# Patient Record
Sex: Male | Born: 1965 | Race: Black or African American | Hispanic: No | Marital: Single
Health system: Midwestern US, Community
[De-identification: ages and names within clinical notes are randomized; demographics above are authoritative.]

## PROBLEM LIST (undated history)

## (undated) ENCOUNTER — Ambulatory Visit: Admission: EM | Payer: Commercial Managed Care - PPO | Source: Home / Self Care

## (undated) DIAGNOSIS — R0602 Shortness of breath: Secondary | ICD-10-CM

## (undated) DIAGNOSIS — J9819 Other pulmonary collapse: Secondary | ICD-10-CM

## (undated) DIAGNOSIS — I10 Essential (primary) hypertension: Secondary | ICD-10-CM

## (undated) DIAGNOSIS — R51 Headache: Secondary | ICD-10-CM

## (undated) DIAGNOSIS — G4730 Sleep apnea, unspecified: Secondary | ICD-10-CM

## (undated) HISTORY — DX: Shortness of breath: R06.02

## (undated) HISTORY — DX: Sleep apnea, unspecified: G47.30

## (undated) HISTORY — DX: Essential (primary) hypertension: I10

## (undated) HISTORY — DX: Other pulmonary collapse: J98.19

## (undated) HISTORY — DX: Headache: R51

---

## 2000-11-09 ENCOUNTER — Encounter: Payer: Self-pay | Admitting: Family Medicine

## 2000-11-09 ENCOUNTER — Encounter: Admission: RE | Admit: 2000-11-09 | Discharge: 2000-11-09 | Payer: Self-pay | Admitting: Family Medicine

## 2003-08-21 ENCOUNTER — Encounter: Admission: RE | Admit: 2003-08-21 | Discharge: 2003-08-21 | Payer: Self-pay | Admitting: Family Medicine

## 2003-12-10 ENCOUNTER — Ambulatory Visit (HOSPITAL_BASED_OUTPATIENT_CLINIC_OR_DEPARTMENT_OTHER): Admission: RE | Admit: 2003-12-10 | Discharge: 2003-12-10 | Payer: Self-pay | Admitting: Family Medicine

## 2004-05-21 ENCOUNTER — Ambulatory Visit (HOSPITAL_COMMUNITY): Admission: RE | Admit: 2004-05-21 | Discharge: 2004-05-21 | Payer: Self-pay | Admitting: *Deleted

## 2005-02-28 ENCOUNTER — Inpatient Hospital Stay (HOSPITAL_COMMUNITY): Admission: EM | Admit: 2005-02-28 | Discharge: 2005-03-03 | Payer: Self-pay | Admitting: Emergency Medicine

## 2006-08-03 ENCOUNTER — Emergency Department (HOSPITAL_COMMUNITY): Admission: EM | Admit: 2006-08-03 | Discharge: 2006-08-03 | Payer: Self-pay | Admitting: Emergency Medicine

## 2011-10-17 ENCOUNTER — Encounter: Payer: Self-pay | Admitting: *Deleted

## 2011-10-17 ENCOUNTER — Other Ambulatory Visit: Payer: Self-pay | Admitting: *Deleted

## 2012-04-06 ENCOUNTER — Encounter: Payer: Self-pay | Admitting: Cardiology

## 2012-07-25 ENCOUNTER — Encounter: Payer: Self-pay | Admitting: Cardiology

## 2014-03-13 ENCOUNTER — Emergency Department (HOSPITAL_COMMUNITY)
Admission: EM | Admit: 2014-03-13 | Discharge: 2014-03-13 | Disposition: A | Discharge status: Home / Self Care | Payer: Self-pay | Attending: Emergency Medicine | Admitting: Emergency Medicine

## 2014-03-13 ENCOUNTER — Encounter (HOSPITAL_COMMUNITY): Payer: Self-pay | Admitting: Emergency Medicine

## 2014-03-13 DIAGNOSIS — Z8669 Personal history of other diseases of the nervous system and sense organs: Secondary | ICD-10-CM | POA: Insufficient documentation

## 2014-03-13 DIAGNOSIS — R197 Diarrhea, unspecified: Secondary | ICD-10-CM | POA: Insufficient documentation

## 2014-03-13 DIAGNOSIS — R110 Nausea: Secondary | ICD-10-CM | POA: Insufficient documentation

## 2014-03-13 DIAGNOSIS — K5900 Constipation, unspecified: Secondary | ICD-10-CM | POA: Insufficient documentation

## 2014-03-13 DIAGNOSIS — Z8709 Personal history of other diseases of the respiratory system: Secondary | ICD-10-CM | POA: Insufficient documentation

## 2014-03-13 DIAGNOSIS — Z87891 Personal history of nicotine dependence: Secondary | ICD-10-CM | POA: Insufficient documentation

## 2014-03-13 DIAGNOSIS — I10 Essential (primary) hypertension: Secondary | ICD-10-CM | POA: Insufficient documentation

## 2014-03-13 DIAGNOSIS — R1013 Epigastric pain: Secondary | ICD-10-CM | POA: Insufficient documentation

## 2014-03-13 DIAGNOSIS — R1032 Left lower quadrant pain: Secondary | ICD-10-CM

## 2014-03-13 LAB — URINALYSIS, ROUTINE W REFLEX MICROSCOPIC
Bilirubin Urine: NEGATIVE
Glucose, UA: NEGATIVE mg/dL
Hgb urine dipstick: NEGATIVE
Ketones, ur: 15 mg/dL — AB
Leukocytes, UA: NEGATIVE
Nitrite: NEGATIVE
Protein, ur: NEGATIVE mg/dL
Specific Gravity, Urine: 1.012 (ref 1.005–1.030)
Urobilinogen, UA: 0.2 mg/dL (ref 0.0–1.0)
pH: 7 (ref 5.0–8.0)

## 2014-03-13 LAB — COMPREHENSIVE METABOLIC PANEL
ALT: 17 U/L (ref 0–53)
AST: 17 U/L (ref 0–37)
Albumin: 4.2 g/dL (ref 3.5–5.2)
Alkaline Phosphatase: 79 U/L (ref 39–117)
Anion gap: 14 (ref 5–15)
BUN: 11 mg/dL (ref 6–23)
CO2: 28 mEq/L (ref 19–32)
Calcium: 9.8 mg/dL (ref 8.4–10.5)
Chloride: 99 mEq/L (ref 96–112)
Creatinine, Ser: 1.2 mg/dL (ref 0.50–1.35)
GFR calc Af Amer: 82 mL/min — ABNORMAL LOW (ref >90)
GFR calc non Af Amer: 70 mL/min — ABNORMAL LOW (ref >90)
Glucose, Bld: 107 mg/dL — ABNORMAL HIGH (ref 70–99)
Potassium: 4.3 mEq/L (ref 3.7–5.3)
Sodium: 141 mEq/L (ref 137–147)
Total Bilirubin: 0.8 mg/dL (ref 0.3–1.2)
Total Protein: 8.3 g/dL (ref 6.0–8.3)

## 2014-03-13 LAB — CBC WITH DIFFERENTIAL/PLATELET
Basophils Absolute: 0 10*3/uL (ref 0.0–0.1)
Basophils Relative: 0 % (ref 0–1)
Eosinophils Absolute: 0.1 10*3/uL (ref 0.0–0.7)
Eosinophils Relative: 1 % (ref 0–5)
HCT: 47 % (ref 39.0–52.0)
Hemoglobin: 16.3 g/dL (ref 13.0–17.0)
Lymphocytes Relative: 11 % — ABNORMAL LOW (ref 12–46)
Lymphs Abs: 1 10*3/uL (ref 0.7–4.0)
MCH: 31.3 pg (ref 26.0–34.0)
MCHC: 34.7 g/dL (ref 30.0–36.0)
MCV: 90.4 fL (ref 78.0–100.0)
Monocytes Absolute: 0.7 10*3/uL (ref 0.1–1.0)
Monocytes Relative: 8 % (ref 3–12)
Neutro Abs: 6.7 10*3/uL (ref 1.7–7.7)
Neutrophils Relative %: 80 % — ABNORMAL HIGH (ref 43–77)
Platelets: 280 10*3/uL (ref 150–400)
RBC: 5.2 MIL/uL (ref 4.22–5.81)
RDW: 13.3 % (ref 11.5–15.5)
WBC: 8.4 10*3/uL (ref 4.0–10.5)

## 2014-03-13 LAB — LIPASE, BLOOD: Lipase: 38 U/L (ref 11–59)

## 2014-03-13 MED ORDER — ONDANSETRON 4 MG PO TBDP: 4.0000 mg | ORAL_TABLET | Freq: Three times a day (TID) | ORAL | Status: DC | PRN

## 2014-03-13 MED ORDER — RANITIDINE HCL 150 MG/10ML PO SYRP
150.0000 mg | ORAL_SOLUTION | Freq: Once | ORAL | Status: AC
  Administered 2014-03-13: 150 mg via ORAL
  Filled 2014-03-13: qty 10

## 2014-03-13 MED ORDER — OXYCODONE-ACETAMINOPHEN 5-325 MG PO TABS
1.0000 | ORAL_TABLET | Freq: Once | ORAL | Status: AC
  Administered 2014-03-13: 1 via ORAL
  Filled 2014-03-13: qty 1

## 2014-03-13 MED ORDER — MORPHINE SULFATE 4 MG/ML IJ SOLN
4.0000 mg | Freq: Once | INTRAMUSCULAR | Status: AC
  Administered 2014-03-13: 4 mg via INTRAVENOUS
  Filled 2014-03-13: qty 1

## 2014-03-13 MED ORDER — RANITIDINE HCL 150 MG PO CAPS: 150.0000 mg | ORAL_CAPSULE | Freq: Every day | ORAL | Status: DC

## 2014-03-13 MED ORDER — GI COCKTAIL ~~LOC~~
30.0000 mL | Freq: Once | ORAL | Status: AC
  Administered 2014-03-13: 30 mL via ORAL
  Filled 2014-03-13: qty 30

## 2014-03-13 MED ORDER — ONDANSETRON 4 MG PO TBDP
4.0000 mg | ORAL_TABLET | Freq: Once | ORAL | Status: AC
  Administered 2014-03-13: 4 mg via ORAL
  Filled 2014-03-13: qty 1

## 2014-03-13 MED ORDER — SODIUM CHLORIDE 0.9 % IV BOLUS (SEPSIS)
1000.0000 mL | Freq: Once | INTRAVENOUS | Status: AC
  Administered 2014-03-13: 1000 mL via INTRAVENOUS

## 2014-03-13 MED ORDER — HYDROMORPHONE HCL 1 MG/ML IJ SOLN
1.0000 mg | Freq: Once | INTRAMUSCULAR | Status: AC
  Administered 2014-03-13: 1 mg via INTRAVENOUS
  Filled 2014-03-13: qty 1

## 2014-03-13 MED ORDER — AMOXICILLIN-POT CLAVULANATE 500-125 MG PO TABS: 1.0000 | ORAL_TABLET | Freq: Three times a day (TID) | ORAL | Status: AC

## 2014-03-13 MED ORDER — OXYCODONE-ACETAMINOPHEN 5-325 MG PO TABS: 2.0000 | ORAL_TABLET | Freq: Four times a day (QID) | ORAL | Status: DC | PRN

## 2014-03-13 NOTE — ED Notes (Signed)
Pt reports having mid to lower abd pain that started on Saturday. Pain does radiate to LLQ. Having n/v, reports having laxative on Sunday and now having green stools. Denies urinary symptoms.

## 2014-03-13 NOTE — Discharge Instructions (Signed)
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
## 2014-03-14 LAB — H. PYLORI ANTIBODY, IGG: H Pylori IgG: >8 {ISR} — ABNORMAL HIGH

## 2014-03-14 NOTE — ED Provider Notes (Signed)
Medical screening examination/treatment/procedure(s) were conducted as a shared visit with non-physician practitioner(s) or resident  and myself.  I personally evaluated the patient during the encounter and agree with the findings.   I have personally reviewed any xrays and/ or EKG's with the provider and I agree with interpretation.   Patient is mild left lower corner pain for 4-5 days. No bleeding, no vomiting. On exam patient mild left lower quadrant abdominal pain no guarding, no peritonitis. Discussed CT scan versus treating apparently for diverticulitis, patient comfortable treating apparently and will return for worsening symptoms or no improvement. No fever, no wbc elevation, benign abdo exam.  Left lower quadrant abdominal pain.   Mariea Clonts, MD 03/14/14 [phone]

## 2015-08-19 ENCOUNTER — Emergency Department (HOSPITAL_COMMUNITY): Payer: Commercial Managed Care - PPO

## 2015-08-19 ENCOUNTER — Emergency Department (HOSPITAL_COMMUNITY)
Admission: EM | Admit: 2015-08-19 | Discharge: 2015-08-19 | Disposition: A | Discharge status: Home / Self Care | Payer: Commercial Managed Care - PPO | Attending: Emergency Medicine | Admitting: Emergency Medicine

## 2015-08-19 ENCOUNTER — Encounter (HOSPITAL_COMMUNITY): Payer: Self-pay | Admitting: Emergency Medicine

## 2015-08-19 DIAGNOSIS — Z79899 Other long term (current) drug therapy: Secondary | ICD-10-CM | POA: Insufficient documentation

## 2015-08-19 DIAGNOSIS — S199XXA Unspecified injury of neck, initial encounter: Secondary | ICD-10-CM | POA: Insufficient documentation

## 2015-08-19 DIAGNOSIS — Z87891 Personal history of nicotine dependence: Secondary | ICD-10-CM | POA: Insufficient documentation

## 2015-08-19 DIAGNOSIS — Y9389 Activity, other specified: Secondary | ICD-10-CM | POA: Insufficient documentation

## 2015-08-19 DIAGNOSIS — S0990XA Unspecified injury of head, initial encounter: Secondary | ICD-10-CM | POA: Diagnosis not present

## 2015-08-19 DIAGNOSIS — Y998 Other external cause status: Secondary | ICD-10-CM | POA: Diagnosis not present

## 2015-08-19 DIAGNOSIS — I10 Essential (primary) hypertension: Secondary | ICD-10-CM | POA: Diagnosis not present

## 2015-08-19 DIAGNOSIS — M549 Dorsalgia, unspecified: Secondary | ICD-10-CM

## 2015-08-19 DIAGNOSIS — S299XXA Unspecified injury of thorax, initial encounter: Secondary | ICD-10-CM | POA: Diagnosis not present

## 2015-08-19 DIAGNOSIS — Y92410 Unspecified street and highway as the place of occurrence of the external cause: Secondary | ICD-10-CM | POA: Diagnosis not present

## 2015-08-19 DIAGNOSIS — M542 Cervicalgia: Secondary | ICD-10-CM

## 2015-08-19 MED ORDER — ACETAMINOPHEN 325 MG PO TABS
650.0000 mg | ORAL_TABLET | Freq: Once | ORAL | Status: AC
  Administered 2015-08-19: 650 mg via ORAL
  Filled 2015-08-19: qty 2

## 2015-08-19 MED ORDER — METHOCARBAMOL 500 MG PO TABS: 500.0000 mg | ORAL_TABLET | Freq: Two times a day (BID) | ORAL | Status: DC

## 2015-08-19 MED ORDER — IBUPROFEN 800 MG PO TABS: 800.0000 mg | ORAL_TABLET | Freq: Three times a day (TID) | ORAL | Status: DC

## 2015-08-19 NOTE — ED Notes (Signed)
Patient transported to X-ray 
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
## 2015-08-19 NOTE — ED Notes (Signed)
Patient transported to CT 
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
## 2016-06-19 DIAGNOSIS — M79671 Pain in right foot: Secondary | ICD-10-CM | POA: Diagnosis not present

## 2016-06-19 DIAGNOSIS — M79672 Pain in left foot: Secondary | ICD-10-CM | POA: Diagnosis not present

## 2016-06-19 DIAGNOSIS — Z79899 Other long term (current) drug therapy: Secondary | ICD-10-CM | POA: Diagnosis not present

## 2016-06-19 DIAGNOSIS — G629 Polyneuropathy, unspecified: Secondary | ICD-10-CM | POA: Diagnosis not present

## 2016-08-17 DIAGNOSIS — M25561 Pain in right knee: Secondary | ICD-10-CM | POA: Diagnosis not present

## 2016-08-17 DIAGNOSIS — R1030 Lower abdominal pain, unspecified: Secondary | ICD-10-CM | POA: Diagnosis not present

## 2016-08-19 DIAGNOSIS — R1030 Lower abdominal pain, unspecified: Secondary | ICD-10-CM | POA: Diagnosis not present

## 2016-09-27 DIAGNOSIS — Z125 Encounter for screening for malignant neoplasm of prostate: Secondary | ICD-10-CM | POA: Diagnosis not present

## 2016-09-27 DIAGNOSIS — Z0000 Encounter for general adult medical examination without abnormal findings: Secondary | ICD-10-CM | POA: Diagnosis not present

## 2016-09-27 DIAGNOSIS — R030 Elevated blood-pressure reading, without diagnosis of hypertension: Secondary | ICD-10-CM | POA: Diagnosis not present

## 2016-09-27 DIAGNOSIS — Z13220 Encounter for screening for lipoid disorders: Secondary | ICD-10-CM | POA: Diagnosis not present

## 2016-09-27 DIAGNOSIS — Z23 Encounter for immunization: Secondary | ICD-10-CM | POA: Diagnosis not present

## 2016-11-29 DIAGNOSIS — K921 Melena: Secondary | ICD-10-CM | POA: Diagnosis not present

## 2016-11-29 DIAGNOSIS — Z1211 Encounter for screening for malignant neoplasm of colon: Secondary | ICD-10-CM | POA: Diagnosis not present

## 2016-11-29 DIAGNOSIS — K5900 Constipation, unspecified: Secondary | ICD-10-CM | POA: Diagnosis not present

## 2016-12-09 DIAGNOSIS — D124 Benign neoplasm of descending colon: Secondary | ICD-10-CM | POA: Diagnosis not present

## 2016-12-09 DIAGNOSIS — D122 Benign neoplasm of ascending colon: Secondary | ICD-10-CM | POA: Diagnosis not present

## 2016-12-09 DIAGNOSIS — Z1211 Encounter for screening for malignant neoplasm of colon: Secondary | ICD-10-CM | POA: Diagnosis not present

## 2016-12-09 DIAGNOSIS — K635 Polyp of colon: Secondary | ICD-10-CM | POA: Diagnosis not present

## 2016-12-21 DIAGNOSIS — R030 Elevated blood-pressure reading, without diagnosis of hypertension: Secondary | ICD-10-CM | POA: Diagnosis not present

## 2016-12-21 DIAGNOSIS — G4733 Obstructive sleep apnea (adult) (pediatric): Secondary | ICD-10-CM | POA: Diagnosis not present

## 2017-06-17 DIAGNOSIS — M79672 Pain in left foot: Secondary | ICD-10-CM | POA: Diagnosis not present

## 2017-06-17 DIAGNOSIS — I10 Essential (primary) hypertension: Secondary | ICD-10-CM | POA: Diagnosis not present

## 2017-06-17 DIAGNOSIS — M25512 Pain in left shoulder: Secondary | ICD-10-CM | POA: Diagnosis not present

## 2017-07-13 ENCOUNTER — Other Ambulatory Visit: Payer: Self-pay | Admitting: Family Medicine

## 2017-07-13 ENCOUNTER — Ambulatory Visit
Admission: RE | Admit: 2017-07-13 | Discharge: 2017-07-13 | Disposition: A | Discharge status: Home / Self Care | Payer: Commercial Managed Care - PPO | Source: Ambulatory Visit | Attending: Family Medicine | Admitting: Family Medicine

## 2017-07-13 DIAGNOSIS — M25512 Pain in left shoulder: Secondary | ICD-10-CM | POA: Diagnosis not present

## 2017-07-13 DIAGNOSIS — M79672 Pain in left foot: Secondary | ICD-10-CM

## 2017-07-13 DIAGNOSIS — M79671 Pain in right foot: Secondary | ICD-10-CM | POA: Diagnosis not present

## 2017-07-13 DIAGNOSIS — R52 Pain, unspecified: Secondary | ICD-10-CM

## 2017-07-15 DIAGNOSIS — M542 Cervicalgia: Secondary | ICD-10-CM | POA: Diagnosis not present

## 2017-07-15 DIAGNOSIS — M2560 Stiffness of unspecified joint, not elsewhere classified: Secondary | ICD-10-CM | POA: Diagnosis not present

## 2017-07-15 DIAGNOSIS — M25512 Pain in left shoulder: Secondary | ICD-10-CM | POA: Diagnosis not present

## 2017-09-15 DIAGNOSIS — M79672 Pain in left foot: Secondary | ICD-10-CM | POA: Diagnosis not present

## 2017-09-15 DIAGNOSIS — M79671 Pain in right foot: Secondary | ICD-10-CM | POA: Diagnosis not present

## 2017-09-15 DIAGNOSIS — M216X9 Other acquired deformities of unspecified foot: Secondary | ICD-10-CM | POA: Diagnosis not present

## 2017-09-15 DIAGNOSIS — G5792 Unspecified mononeuropathy of left lower limb: Secondary | ICD-10-CM | POA: Diagnosis not present

## 2017-09-15 DIAGNOSIS — G5791 Unspecified mononeuropathy of right lower limb: Secondary | ICD-10-CM | POA: Diagnosis not present

## 2018-10-31 ENCOUNTER — Emergency Department (HOSPITAL_COMMUNITY): Payer: Commercial Managed Care - PPO

## 2018-10-31 ENCOUNTER — Emergency Department (HOSPITAL_COMMUNITY)
Admission: EM | Admit: 2018-10-31 | Discharge: 2018-10-31 | Disposition: A | Discharge status: Home / Self Care | Payer: Commercial Managed Care - PPO | Attending: Emergency Medicine | Admitting: Emergency Medicine

## 2018-10-31 ENCOUNTER — Encounter (HOSPITAL_COMMUNITY): Payer: Self-pay | Admitting: Emergency Medicine

## 2018-10-31 ENCOUNTER — Other Ambulatory Visit: Payer: Self-pay

## 2018-10-31 DIAGNOSIS — R531 Weakness: Secondary | ICD-10-CM | POA: Diagnosis not present

## 2018-10-31 DIAGNOSIS — R200 Anesthesia of skin: Secondary | ICD-10-CM | POA: Insufficient documentation

## 2018-10-31 DIAGNOSIS — Z87891 Personal history of nicotine dependence: Secondary | ICD-10-CM | POA: Diagnosis not present

## 2018-10-31 DIAGNOSIS — R51 Headache: Secondary | ICD-10-CM | POA: Diagnosis present

## 2018-10-31 DIAGNOSIS — I10 Essential (primary) hypertension: Secondary | ICD-10-CM | POA: Diagnosis not present

## 2018-10-31 DIAGNOSIS — R519 Headache, unspecified: Secondary | ICD-10-CM

## 2018-10-31 DIAGNOSIS — R202 Paresthesia of skin: Secondary | ICD-10-CM | POA: Diagnosis not present

## 2018-10-31 LAB — DIFFERENTIAL
Abs Immature Granulocytes: 0.02 10*3/uL (ref 0.00–0.07)
Basophils Absolute: 0 10*3/uL (ref 0.0–0.1)
Basophils Relative: 0 %
Eosinophils Absolute: 0 10*3/uL (ref 0.0–0.5)
Eosinophils Relative: 1 %
Immature Granulocytes: 0 %
Lymphocytes Relative: 15 %
Lymphs Abs: 1.1 10*3/uL (ref 0.7–4.0)
Monocytes Absolute: 0.7 10*3/uL (ref 0.1–1.0)
Monocytes Relative: 10 %
Neutro Abs: 5.1 10*3/uL (ref 1.7–7.7)
Neutrophils Relative %: 74 %

## 2018-10-31 LAB — COMPREHENSIVE METABOLIC PANEL
ALT: 16 U/L (ref 0–44)
AST: 18 U/L (ref 15–41)
Albumin: 3.5 g/dL (ref 3.5–5.0)
Alkaline Phosphatase: 68 U/L (ref 38–126)
Anion gap: 11 (ref 5–15)
BUN: 12 mg/dL (ref 6–20)
CO2: 23 mmol/L (ref 22–32)
Calcium: 9.1 mg/dL (ref 8.9–10.3)
Chloride: 104 mmol/L (ref 98–111)
Creatinine, Ser: 1.13 mg/dL (ref 0.61–1.24)
GFR calc Af Amer: >60 mL/min (ref >60)
GFR calc non Af Amer: >60 mL/min (ref >60)
Glucose, Bld: 105 mg/dL — ABNORMAL HIGH (ref 70–99)
Potassium: 3.7 mmol/L (ref 3.5–5.1)
Sodium: 138 mmol/L (ref 135–145)
Total Bilirubin: 0.6 mg/dL (ref 0.3–1.2)
Total Protein: 6.9 g/dL (ref 6.5–8.1)

## 2018-10-31 LAB — CBC
HCT: 44.1 % (ref 39.0–52.0)
Hemoglobin: 14.8 g/dL (ref 13.0–17.0)
MCH: 30 pg (ref 26.0–34.0)
MCHC: 33.6 g/dL (ref 30.0–36.0)
MCV: 89.5 fL (ref 80.0–100.0)
Platelets: 258 10*3/uL (ref 150–400)
RBC: 4.93 MIL/uL (ref 4.22–5.81)
RDW: 13.4 % (ref 11.5–15.5)
WBC: 7 10*3/uL (ref 4.0–10.5)
nRBC: 0 % (ref 0.0–0.2)

## 2018-10-31 LAB — PROTIME-INR
INR: 1 (ref 0.8–1.2)
Prothrombin Time: 12.8 seconds (ref 11.4–15.2)

## 2018-10-31 LAB — CBG MONITORING, ED: Glucose-Capillary: 101 mg/dL — ABNORMAL HIGH (ref 70–99)

## 2018-10-31 LAB — I-STAT CHEM 8, ED
BUN: 14 mg/dL (ref 6–20)
Calcium, Ion: 1.1 mmol/L — ABNORMAL LOW (ref 1.15–1.40)
Chloride: 106 mmol/L (ref 98–111)
Creatinine, Ser: 1.2 mg/dL (ref 0.61–1.24)
Glucose, Bld: 101 mg/dL — ABNORMAL HIGH (ref 70–99)
HCT: 45 % (ref 39.0–52.0)
Hemoglobin: 15.3 g/dL (ref 13.0–17.0)
Potassium: 3.7 mmol/L (ref 3.5–5.1)
Sodium: 139 mmol/L (ref 135–145)
TCO2: 28 mmol/L (ref 22–32)

## 2018-10-31 LAB — RAPID URINE DRUG SCREEN, HOSP PERFORMED
Amphetamines: NOT DETECTED
Barbiturates: NOT DETECTED
Benzodiazepines: NOT DETECTED
Cocaine: NOT DETECTED
Opiates: NOT DETECTED
Tetrahydrocannabinol: POSITIVE — AB

## 2018-10-31 LAB — TROPONIN I: Troponin I: <0.03 ng/mL (ref <0.03)

## 2018-10-31 LAB — APTT: aPTT: 29 seconds (ref 24–36)

## 2018-10-31 MED ORDER — METOCLOPRAMIDE HCL 5 MG/ML IJ SOLN
10.0000 mg | Freq: Once | INTRAMUSCULAR | Status: AC
  Administered 2018-10-31: 10 mg via INTRAVENOUS
  Filled 2018-10-31: qty 2

## 2018-10-31 MED ORDER — KETOROLAC TROMETHAMINE 15 MG/ML IJ SOLN
15.0000 mg | Freq: Once | INTRAMUSCULAR | Status: AC
  Administered 2018-10-31: 15 mg via INTRAVENOUS
  Filled 2018-10-31: qty 1

## 2018-10-31 MED ORDER — HYDRALAZINE HCL 20 MG/ML IJ SOLN
5.0000 mg | Freq: Once | INTRAMUSCULAR | Status: AC
Start: 2018-10-31 — End: 2018-10-31
  Administered 2018-10-31: 5 mg via INTRAVENOUS
  Filled 2018-10-31: qty 1

## 2018-10-31 MED ORDER — LISINOPRIL 20 MG PO TABS: 20.0000 mg | ORAL_TABLET | Freq: Every day | ORAL | 0 refills | Status: DC

## 2018-10-31 MED ORDER — SODIUM CHLORIDE 0.9% FLUSH
3.0000 mL | Freq: Once | INTRAVENOUS | Status: AC
  Administered 2018-10-31: 3 mL via INTRAVENOUS
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
## 2018-10-31 NOTE — Discharge Instructions (Signed)
Thank you for allowing me to care for you today. Please return to the emergency department if you have new or worsening symptoms. Take your medications as instructed.  ° °

## 2018-10-31 NOTE — ED Notes (Signed)
Patient transported to CT 

## 2018-10-31 NOTE — ED Triage Notes (Signed)
Pt states he has been having headaches for a week and checked his BP on Saturday- BP 169/113. Pt feels dizzy at times and has tingling in his left arm for a week.

## 2019-11-30 ENCOUNTER — Ambulatory Visit
Admission: EM | Admit: 2019-11-30 | Discharge: 2019-11-30 | Disposition: A | Discharge status: Home / Self Care | Payer: Commercial Managed Care - PPO | Attending: Physician Assistant | Admitting: Physician Assistant

## 2019-11-30 ENCOUNTER — Other Ambulatory Visit: Payer: Self-pay

## 2019-11-30 DIAGNOSIS — R519 Headache, unspecified: Secondary | ICD-10-CM | POA: Diagnosis not present

## 2019-11-30 MED ORDER — LISINOPRIL 20 MG PO TABS: 20.0000 mg | ORAL_TABLET | Freq: Every day | ORAL | 0 refills | Status: AC

## 2019-11-30 NOTE — ED Triage Notes (Signed)
Pt c/o headache x 2-3 days, has not taken anything OTC for relief, states he needs his lisinopril refilled for his headache.

## 2019-11-30 NOTE — Discharge Instructions (Addendum)
Lisinopril refilled. Tylenol/ibuprofen if needed. Keep hydrated, urine should be clear to pale yellow in color. Follow up with PCP for further refills needed. If headache causing dizziness, passing out, go to the ED for further evaluation.
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
## 2019-12-17 ENCOUNTER — Ambulatory Visit (INDEPENDENT_AMBULATORY_CARE_PROVIDER_SITE_OTHER): Payer: Commercial Managed Care - PPO | Admitting: Primary Care

## 2021-07-22 LAB — HEMOGLOBIN A1C: Hemoglobin A1C, External: 6 % — ABNORMAL HIGH (ref 4.3–5.6)

## 2022-03-04 ENCOUNTER — Inpatient Hospital Stay
Admit: 2022-03-04 | Discharge: 2022-03-04 | Disposition: A | Discharge status: Home / Self Care | Payer: BLUE CROSS/BLUE SHIELD | Attending: Emergency Medicine

## 2022-03-04 ENCOUNTER — Emergency Department: Admit: 2022-03-04 | Payer: BLUE CROSS/BLUE SHIELD

## 2022-03-04 DIAGNOSIS — R0789 Other chest pain: Secondary | ICD-10-CM

## 2022-03-04 DIAGNOSIS — I10 Essential (primary) hypertension: Secondary | ICD-10-CM

## 2022-03-04 LAB — BASIC METABOLIC PANEL
Anion Gap: 14 mmol/L (ref 2–17)
BUN: 15 mg/dL (ref 6–20)
CO2: 24 mmol/L (ref 22–29)
Calcium: 9.3 mg/dL (ref 8.6–10.0)
Chloride: 102 mmol/L (ref 98–107)
Creatinine: 1.3 mg/dL (ref 0.7–1.3)
Est, Glom Filt Rate: 65 mL/min/1.73m (ref >=60)
Glucose: 103 mg/dL — ABNORMAL HIGH (ref 70–99)
OSMOLALITY CALCULATED: 280 mOsm/kg (ref 270–287)
Potassium: 3.8 mmol/L (ref 3.5–5.3)
Sodium: 140 mmol/L (ref 135–145)

## 2022-03-04 LAB — HEPATIC FUNCTION PANEL
ALT: 14 U/L (ref 0–50)
AST: 16 U/L (ref 0–50)
Albumin: 4.3 g/dL (ref 3.5–5.2)
Alk Phosphatase: 66 U/L (ref 40–130)
Bilirubin, Direct: <0.2 mg/dL (ref 0.00–0.30)
Total Bilirubin: 0.29 mg/dL (ref 0.00–1.20)
Total Protein: 7.1 g/dL (ref 6.4–8.3)

## 2022-03-04 LAB — CBC WITH AUTO DIFFERENTIAL
Absolute Baso #: 0 10*3/uL (ref 0.0–0.2)
Absolute Eos #: 0 10*3/uL (ref 0.0–0.5)
Absolute Lymph #: 1.6 10*3/uL (ref 1.0–3.2)
Absolute Mono #: 0.7 10*3/uL (ref 0.3–1.0)
Basophils %: 0.4 % (ref 0.0–2.0)
Eosinophils %: 0.5 % (ref 0.0–7.0)
Hematocrit: 45.9 % (ref 38.0–52.0)
Hemoglobin: 15.3 g/dL (ref 13.0–17.3)
Immature Grans (Abs): 0.01 10*3/uL (ref 0.00–0.06)
Immature Granulocytes: 0.1 % (ref 0.0–0.6)
Lymphocytes: 19.8 % (ref 15.0–45.0)
MCH: 30 pg (ref 27.0–34.5)
MCHC: 33.3 g/dL (ref 32.0–36.0)
MCV: 90 fL (ref 84.0–100.0)
MPV: 9.1 fL (ref 7.2–13.2)
Monocytes: 8.3 % (ref 4.0–12.0)
Neutrophils %: 70.9 % (ref 42.0–74.0)
Neutrophils Absolute: 5.7 10*3/uL (ref 1.6–7.3)
Platelets: 268 10*3/uL (ref 140–440)
RBC: 5.1 x10e6/mcL (ref 4.00–5.60)
RDW: 13.4 % (ref 11.0–16.0)
WBC: 8 10*3/uL (ref 3.8–10.6)

## 2022-03-04 LAB — LIPASE: Lipase: 66 U/L — ABNORMAL HIGH (ref 13–60)

## 2022-03-04 LAB — D-DIMER, QUANTITATIVE: D-Dimer, Quant: <=0.27 CD:295178345 (ref 0.19–0.50)

## 2022-03-04 LAB — TROPONIN
Troponin, High Sensitivity: 9 ng/L (ref 0–22)
Troponin, High Sensitivity: 9 ng/L (ref 0–22)

## 2022-03-04 MED ORDER — HYDRALAZINE HCL 20 MG/ML IJ SOLN
20 MG/ML | INTRAMUSCULAR | Status: DC
Start: 2022-03-04 — End: 2022-03-04

## 2022-03-04 MED ORDER — LABETALOL HCL 5 MG/ML IV SOLN
5 MG/ML | Freq: Once | INTRAVENOUS | Status: AC
Start: 2022-03-04 — End: 2022-03-04
  Administered 2022-03-04: 12:00:00 10 mg via INTRAVENOUS

## 2022-03-04 MED ORDER — OMEPRAZOLE 40 MG PO CPDR
40 MG | ORAL_CAPSULE | Freq: Every day | ORAL | 0 refills | Status: AC
Start: 2022-03-04 — End: 2022-03-18

## 2022-03-04 MED ORDER — HYDRALAZINE HCL 20 MG/ML IJ SOLN
20 MG/ML | Freq: Once | INTRAMUSCULAR | Status: AC
Start: 2022-03-04 — End: 2022-03-04
  Administered 2022-03-04: 12:00:00 10 mg via INTRAVENOUS

## 2022-03-04 MED ORDER — ASPIRIN 81 MG PO CHEW
81 MG | Freq: Once | ORAL | Status: AC
Start: 2022-03-04 — End: 2022-03-04
  Administered 2022-03-04: 12:00:00 324 mg via ORAL

## 2022-03-04 MED ORDER — KETOROLAC TROMETHAMINE 15 MG/ML IJ SOLN
15 MG/ML | Freq: Once | INTRAMUSCULAR | Status: AC
Start: 2022-03-04 — End: 2022-03-04
  Administered 2022-03-04: 12:00:00 15 mg via INTRAVENOUS

## 2022-03-04 MED FILL — KETOROLAC TROMETHAMINE 15 MG/ML IJ SOLN: 15 MG/ML | INTRAMUSCULAR | Qty: 1

## 2022-03-04 MED FILL — HYDRALAZINE HCL 20 MG/ML IJ SOLN: 20 MG/ML | INTRAMUSCULAR | Qty: 1

## 2022-03-04 MED FILL — LABETALOL HCL 5 MG/ML IV SOLN: 5 MG/ML | INTRAVENOUS | Qty: 4

## 2022-03-04 MED FILL — ASPIRIN 81 MG PO CHEW: 81 MG | ORAL | Qty: 4

## 2022-03-04 NOTE — ED Provider Notes (Signed)
[facility]  EMERGENCY DEPARTMENT ENCOUNTER      Pt Name: Derek Dunlap  MRN: [id number]  [place] 02-14-1966  Date of evaluation: 03/04/2022  Provider: Benay Pillow, MD  Provider evaluation time: 03/04/22 0710    CHIEF COMPLAINT       Chief Complaint   Patient presents with    Chest Pain     Pt c/o chest pain that started yesterday. Describes pain as sharp.         HISTORY OF PRESENT ILLNESS    Derek Dunlap is a 56 y.o. male who presents to the emergency department    Patient has a history of hypertension and GERD presenting for evaluation of chest pain.  He notes the onset of chest pain yesterday morning described as sharp in the midsternal area.  It lasted up to the afternoon when it started improving but was persistent all day yesterday.  It radiated slightly to the left side of the chest.  He woke up again this morning and had some worsening of it about 4:45 AM on his way to work sitting in a car.  He denies any ripping or tearing sensation.  He has a bit of a cough that sounds more chronic.  No hemoptysis, or vomiting.  He has had a little bit of abdominal discomfort.  He had some minor shortness of breath this morning that is now resolving.  He does not recall any movement related pain.  He it is not pleuritic, it feels better when he takes a big breath.  He works in a [employer].  No prior cardiac history              Nursing Notes were reviewed.    REVIEW OF SYSTEMS       Review of Systems   All other systems reviewed and are negative.      Except as noted above the remainder of the review of systems was reviewed and negative.       PAST MEDICAL HISTORY     Past Medical History:   Diagnosis Date    GERD (gastroesophageal reflux disease)     Hypertension        SURGICAL HISTORY     No past surgical history on file.    CURRENT MEDICATIONS       Previous Medications    LISINOPRIL (PRINIVIL;ZESTRIL) 10 MG TABLET    Take 1 tablet by mouth daily       ALLERGIES     Patient has no known allergies.    FAMILY  HISTORY     No family history on file.       SOCIAL HISTORY       Social History     Socioeconomic History    Marital status: Single           PHYSICAL EXAM       ED Triage Vitals [03/04/22 0715]   BP Temp Temp Source Pulse Respirations SpO2 Height Weight - Scale   (!) 173/108 98.8 F (37.1 C) Oral (!) 108 15 99 % '5\' 7"'  (1.702 m) 188 lb 3.2 oz (85.4 kg)       Physical Exam  Vitals and nursing note reviewed.   Constitutional:       General: He is not in acute distress.     Appearance: Normal appearance. He is not diaphoretic.   HENT:      Head: Normocephalic and atraumatic.      Nose: Nose normal.  Mouth/Throat:      Mouth: Mucous membranes are moist.   Eyes:      Extraocular Movements: Extraocular movements intact.      Conjunctiva/sclera: Conjunctivae normal.   Cardiovascular:      Rate and Rhythm: Regular rhythm. Tachycardia present.      Pulses: Normal pulses.      Heart sounds: Normal heart sounds.   Pulmonary:      Effort: Pulmonary effort is normal.      Breath sounds: Normal breath sounds. No wheezing or rales.   Abdominal:      General: Abdomen is flat. Bowel sounds are normal. There is no distension.      Palpations: Abdomen is soft.      Tenderness: There is no guarding.      Comments: Mild epigastric tenderness no rebound or surgical signs   Musculoskeletal:         General: Normal range of motion.      Comments: No peripheral edema.  Tenderness to the musculature of the left chest wall with no crepitus   Skin:     General: Skin is warm and dry.   Neurological:      General: No focal deficit present.      Mental Status: He is alert.         DIAGNOSTIC RESULTS     EKG: All EKG's are interpreted by the Emergency Department Physician who either signs or Co-signs this chart in the absence of a cardiologist.      RADIOLOGY:   Non-plain film images such as CT, Ultrasound and MRI are read by the radiologist. Plain radiographic images are visualized and preliminarily interpreted by the emergency physician  with the below findings:    Interpretation per the Radiologist below, if available at the time of this note:    XR CHEST PORTABLE   Final Result   No evidence for acute cardiopulmonary process.      If you have questions regarding this report, please feel free to call me    directly using [employer].            ED BEDSIDE ULTRASOUND:   Performed by ED Physician - none    LABS:  Results for orders placed or performed during the hospital encounter of 03/04/22   CBC with Auto Differential   Result Value Ref Range    WBC 8.0 3.8 - 10.6 x10e3/mcL    RBC 5.10 4.00 - 5.60 x10e6/mcL    Hemoglobin 15.3 13.0 - 17.3 g/dL    Hematocrit 45.9 38.0 - 52.0 %    MCV 90.0 84.0 - 100.0 fL    MCH 30.0 27.0 - 34.5 pg    MCHC 33.3 32.0 - 36.0 g/dL    RDW 13.4 11.0 - 16.0 %    Platelets 268 140 - 440 x10e3/mcL    MPV 9.1 7.2 - 13.2 fL    Neutrophils % 70.9 42.0 - 74.0 %    Lymphocytes 19.8 15.0 - 45.0 %    Monocytes 8.3 4.0 - 12.0 %    Eosinophils % 0.5 0.0 - 7.0 %    Basophils % 0.4 0.0 - 2.0 %    Neutrophils Absolute 5.7 1.6 - 7.3 x10e3/mcL    Absolute Lymph # 1.6 1.0 - 3.2 x10e3/mcL    Absolute Mono # 0.7 0.3 - 1.0 x10e3/mcL    Absolute Eos # 0.0 0.0 - 0.5 x10e3/mcL    Absolute Baso # 0.0 0.0 - 0.2 x10e3/mcL    Immature  Granulocytes 0.1 0.0 - 0.6 %    Immature Grans (Abs) 0.01 0.00 - 0.06 x10e3/mcL   Troponin   Result Value Ref Range    Troponin, High Sensitivity 9 0 - 22 ng/L   Basic Metabolic Panel   Result Value Ref Range    Sodium 140 135 - 145 mmol/L    Potassium 3.8 3.5 - 5.3 mmol/L    Chloride 102 98 - 107 mmol/L    CO2 24 22 - 29 mmol/L    Glucose 103 (H) 70 - 99 mg/dL    BUN 15 6 - 20 mg/dL    Creatinine 1.3 0.7 - 1.3 mg/dL    Anion Gap 14 2 - 17 mmol/L    OSMOLALITY CALCULATED 280 270 - 287 mOsm/kg    Calcium 9.3 8.6 - 10.0 mg/dL    Est, Glom Filt Rate 65 >=60 mL/min/1.79m  Hepatic Function Panel   Result Value Ref Range    Total Protein 7.1 6.4 - 8.3 g/dL    Albumin 4.3 3.5 - 5.2 g/dL    Total Bilirubin 0.29 0.00 - 1.20  mg/dL    Alk Phosphatase 66 40 - 130 unit/L    Bilirubin, Direct <0.20 0.00 - 0.30 mg/dL    AST 16 0 - 50 unit/L    ALT 14 0 - 50 unit/L   Lipase   Result Value Ref Range    Lipase 66 (H) 13 - 60 unit/L   D-Dimer, Quantitative   Result Value Ref Range    D-Dimer, Quant <=0.27 0.19 - 0.50 [id number]  Troponin   Result Value Ref Range    Troponin, High Sensitivity 9 0 - 22 ng/L   EKG 12 Lead   Result Value Ref Range    Ventricular Rate 111 BPM    P-R Interval 168 ms    QRS Duration 70 ms    Q-T Interval 314 ms    QTc Calculation (Bazett) 379 ms    P Axis 67 degrees    R Axis 47 degrees    T Axis 60 degrees    Diagnosis       SINUS TACHYCARDIA  SEPTAL INFARCT [40+ MS Q WAVE IN V1/V2], AGE UNDETERMINED  ABNORMAL ECG          EMERGENCY DEPARTMENT COURSE and DIFFERENTIAL DIAGNOSIS/MDM:   Vitals:    Vitals:    03/04/22 0847 03/04/22 0900 03/04/22 0906 03/04/22 0921   BP: (!) 151/91 139/87 139/87 (!) 141/86   Pulse: 92 90 92 91   Resp: '11 19 11 12   ' Temp:       TempSrc:       SpO2: 98% 97% 99% 97%   Weight:       Height:           MDM  Number of Diagnoses or Management Options  Atypical chest pain  Uncontrolled hypertension  Diagnosis management comments: HEART score 3.  Low risk for PE however he is mildly tachycardic.  We will check a D-dimer along with troponin and chest x-ray.  He does have some minor epigastric tenderness and GI etiology is also considered we will check labs regarding that.    Serial troponins per protocol are negative.  Blood pressure improved and his chest pain is resolving.  D-dimer negative.  He does have a minor elevation of lipase is nonspecific.  With a history of GERD this could be gastritis with secondary reflux causing him chest pain.  I will start him back  on a medication for reflux.  He does have a musculoskeletal component.  Overall considered low risk for cardiac etiology however given his uncontrolled blood pressure and evidence of likely LVH per EKG have advised follow-up with his  primary care doctor to have further blood pressure management and outpatient stress.  While here        SCREENINGS       Glasgow Coma Scale  Eye Opening: Spontaneous  Best Verbal Response: Oriented  Best Motor Response: Obeys commands  Glasgow Coma Scale Score: 15             CIWA Assessment  BP: (!) 141/86  Pulse: 91             REASSESSMENT     ED Course as of 03/04/22 0950   Thu Mar 04, 2022   0725 EKG 12 Lead  EKG by my review interpretation demonstrates sinus tachycardia rate of 111 bpm with normal axis.  Nonspecific ST and T wave changes.  LVH type changes. [JC]      ED Course User Index  [JC] Benay Pillow, MD         CONSULTS:  None    PROCEDURES:  Unless otherwise noted below, none     Procedures      FINAL IMPRESSION      1. Atypical chest pain    2. Uncontrolled hypertension          DISPOSITION/PLAN   DISPOSITION Decision To Discharge 03/04/2022 09:36:54 AM      PATIENT REFERRED TO:  primary care doctor    In 3 days  For further evaluation of your condition - for repeat blood pressure check and to consider outpatient stress test      DISCHARGE MEDICATIONS:  New Prescriptions    OMEPRAZOLE (PRILOSEC) 40 MG DELAYED RELEASE CAPSULE    Take 1 capsule by mouth every morning (before breakfast) for 14 days     Controlled Substances Monitoring:          No data to display                (Please note that portions of this note were completed with a voice recognition program.  Efforts were made to edit the dictations but occasionally words are mis-transcribed.)    Benay Pillow, MD (electronically signed)  Attending Emergency Physician            Benay Pillow, MD  03/04/22 [phone]

## 2022-03-05 LAB — EKG 12-LEAD
P Axis: 67 degrees
P-R Interval: 168 ms
Q-T Interval: 314 ms
QRS Duration: 70 ms
QTc Calculation (Bazett): 379 ms
R Axis: 47 degrees
T Axis: 60 degrees
Ventricular Rate: 111 {beats}/min

## 2022-06-19 ENCOUNTER — Inpatient Hospital Stay
Admit: 2022-06-19 | Discharge: 2022-06-19 | Disposition: A | Discharge status: Home / Self Care | Payer: PRIVATE HEALTH INSURANCE | Attending: Emergency Medicine

## 2022-06-19 DIAGNOSIS — M109 Gout, unspecified: Secondary | ICD-10-CM

## 2022-06-19 MED ORDER — NAPROXEN 500 MG PO TABS
500 MG | ORAL_TABLET | Freq: Two times a day (BID) | ORAL | 0 refills | Status: AC
Start: 2022-06-19 — End: ?

## 2022-06-19 MED ORDER — HYDROCODONE-ACETAMINOPHEN 5-325 MG PO TABS
5-325 MG | ORAL_TABLET | Freq: Four times a day (QID) | ORAL | 0 refills | Status: AC | PRN
Start: 2022-06-19 — End: 2022-06-26

## 2022-06-19 MED ORDER — COLCHICINE 0.6 MG PO TABS
0.6 MG | Freq: Once | ORAL | Status: AC
Start: 2022-06-19 — End: 2022-06-19
  Administered 2022-06-19: 17:00:00 0.6 mg via ORAL

## 2022-06-19 MED ORDER — COLCHICINE 0.6 MG PO TABS
0.6 MG | ORAL_TABLET | ORAL | 0 refills | Status: AC | PRN
Start: 2022-06-19 — End: ?

## 2022-06-19 MED ORDER — KETOROLAC TROMETHAMINE 30 MG/ML IJ SOLN
30 MG/ML | Freq: Once | INTRAMUSCULAR | Status: AC
Start: 2022-06-19 — End: 2022-06-19
  Administered 2022-06-19: 17:00:00 30 mg via INTRAMUSCULAR

## 2022-06-19 MED ORDER — HYDROCODONE-ACETAMINOPHEN 5-325 MG PO TABS
5-325 MG | Freq: Once | ORAL | Status: AC
Start: 2022-06-19 — End: 2022-06-19
  Administered 2022-06-19: 17:00:00 1 via ORAL

## 2022-06-19 MED FILL — COLCHICINE 0.6 MG PO TABS: 0.6 MG | ORAL | Qty: 1

## 2022-06-19 MED FILL — KETOROLAC TROMETHAMINE 30 MG/ML IJ SOLN: 30 MG/ML | INTRAMUSCULAR | Qty: 1

## 2022-06-19 MED FILL — HYDROCODONE-ACETAMINOPHEN 5-325 MG PO TABS: 5-325 MG | ORAL | Qty: 1

## 2022-06-19 NOTE — ED Notes (Signed)
Pt provided with crutches an teaching. Ace wrap completed.   Pt alert and oriented. Pt received discharge paperwork. No further questions. Pt assisted with wheelchair.  Pt awaiting for ride in waiting area.

## 2022-06-19 NOTE — ED Provider Notes (Signed)
[facility] DE[facility]  EMERGENCY DEPARTMENT ENCOUNTER      Pt Name: Derek Dunlap  MRN: [id number]  [place] April 11, 1966  Date of evaluation: 06/19/2022  Provider: Vonzella Nipple, MD    CHIEF COMPLAINT       Chief Complaint   Patient presents with    Ankle Pain     Pt biba for c/o left nkle pain, hx of gout in left knee. Took tylenol around 0400 with no relief. Hx of htn, has not taken meds today.       HISTORY OF PRESENT ILLNESS    HPI  57 y.o. male with a history of gout states he was having some pain in his left knee yesterday.  He was able to walk all day yesterday and woke this morning with pain in his left ankle.  He states it feels similar to prior gouty episodes although has not had gout in that ankle before.  He did take Tylenol at 4 AM but it has not helped.  He denies fevers or chills.  He denies any trauma or direct injury.  No other associated symptoms.  Pain is worse with trying to move it or walk on it    Nursing Notes were reviewed.    REVIEW OF SYSTEMS     Review of Systems    No fevers chills or trauma    PAST MEDICAL HISTORY     Past Medical History:   Diagnosis Date    GERD (gastroesophageal reflux disease)     Gout     Hypertension        SURGICAL HISTORY     History reviewed. No pertinent surgical history.    CURRENT MEDICATIONS       Previous Medications    LISINOPRIL (PRINIVIL;ZESTRIL) 10 MG TABLET    Take 1 tablet by mouth daily    OMEPRAZOLE (PRILOSEC) 40 MG DELAYED RELEASE CAPSULE    Take 1 capsule by mouth every morning (before breakfast) for 14 days       ALLERGIES     Patient has no known allergies.    FAMILY HISTORY     History reviewed. No pertinent family history.     SOCIAL HISTORY       Social History     Socioeconomic History    Marital status: Single     Spouse name: None    Number of children: None    Years of education: None    Highest education level: None   Tobacco Use    Smoking status: Never    Smokeless tobacco: Never   Substance and Sexual Activity    Alcohol use: Not  Currently       SCREENINGS         Glasgow Coma Scale  Eye Opening: Spontaneous  Best Verbal Response: Oriented  Best Motor Response: Obeys commands  Glasgow Coma Scale Score: 15                     CIWA Assessment  BP: (!) 170/107  Pulse: 90                 PHYSICAL EXAM    (up to 7 for level 4, 8 or more for level 5)     ED Triage Vitals [06/19/22 1143]   BP Temp Temp Source Pulse Respirations SpO2 Height Weight - Scale   (!) 170/107 98.5 F (36.9 C) Oral 90 16 99 % 1.702 m (5\' 7" ) 88.5 kg (195 lb)  Physical Exam  Vitals and nursing note reviewed.   Constitutional:       Appearance: Normal appearance.   HENT:      Head: Normocephalic and atraumatic.      Nose: Nose normal.   Eyes:      Extraocular Movements: Extraocular movements intact.      Pupils: Pupils are equal, round, and reactive to light.   Musculoskeletal:      Comments: Left ankle with mild diffuse soft tissue swelling.  No overlying erythema or warmth.  DP pulses are intact.  It is diffusely tender especially over the medial aspect.   Neurological:      Mental Status: He is alert.   Psychiatric:         Mood and Affect: Mood normal.         Behavior: Behavior normal.         Procedures    DIAGNOSTIC RESULTS         No orders to display         LABS:  Labs Reviewed - No data to display    All other labs were within normal range or not returned as of this dictation.    EMERGENCY DEPARTMENT COURSE/REASSESSMENT and MDM:   Vitals:    Vitals:    06/19/22 1143   BP: (!) 170/107   Pulse: 90   Resp: 16   Temp: 98.5 F (36.9 C)   TempSrc: Oral   SpO2: 99%   Weight: 88.5 kg (195 lb)   Height: 1.702 m (5\' 7" )       Medical Decision Making  Risk  Prescription drug management.         ED Course:    ED Course as of 06/19/22 1244   Sat Jun 19, 2022   1240 Patient with inflammatory process in the left ankle.  Nontraumatic.  Not consistent with joint infection.  Will treat for presumed acute gouty arthritis and have him return if worse or not improving [CM]       ED Course User Index  [CM] Jun 21, 2022, MD       Pulse oximetry is 99% on room air    which is acceptable for this patient by my interpretation        FINAL IMPRESSION      1. Acute gout of left ankle, unspecified cause          DISPOSITION/PLAN   DISPOSITION Decision To Discharge 06/19/2022 12:40:51 PM      PATIENT REFERRED TO:  Your primary care physician  Call [phone] to arrange for primary care physician if you do not have one.  Alternatively, you may contact the [facility] transitions clinic:  [address] [place]  [phone]  Schedule an appointment as soon as possible for a visit in 5 days        DISCHARGE MEDICATIONS:  New Prescriptions    COLCHICINE (COLCRYS) 0.6 MG TABLET    Take 1 tablet by mouth every hour as needed (gout pain)    HYDROCODONE-ACETAMINOPHEN (NORCO) 5-325 MG PER TABLET    Take 1 tablet by mouth every 6 hours as needed for Pain for up to 6 doses. Max Daily Amount: 4 tablets    NAPROXEN (NAPROSYN) 500 MG TABLET    Take 1 tablet by mouth 2 times daily (with meals)     Controlled Substances Monitoring:          No data to display                (  Please note that portions of this note were completed with a voice recognition program.  Efforts were made to edit the dictations but occasionally words are mis-transcribed.)    Vonzella Nipple, MD (electronically signed)  Attending Emergency Physician           Vonzella Nipple, MD  06/19/22 1244
